# Patient Record
Sex: Female | Born: 1965 | Race: White | Hispanic: No | Marital: Single | Smoking: Never smoker
Health system: Southern US, Community
[De-identification: ages and names within clinical notes are randomized; demographics above are authoritative.]

---

## 1999-03-23 ENCOUNTER — Ambulatory Visit (HOSPITAL_COMMUNITY): Admission: RE | Admit: 1999-03-23 | Discharge: 1999-03-23 | Payer: Self-pay | Admitting: Orthopedic Surgery

## 1999-03-23 ENCOUNTER — Encounter: Payer: Self-pay | Admitting: Orthopedic Surgery

## 1999-06-19 ENCOUNTER — Encounter: Admission: RE | Admit: 1999-06-19 | Discharge: 1999-09-07 | Payer: Self-pay | Admitting: Anesthesiology

## 1999-09-07 ENCOUNTER — Encounter: Admission: RE | Admit: 1999-09-07 | Discharge: 1999-12-06 | Payer: Self-pay | Admitting: Anesthesiology

## 1999-10-27 ENCOUNTER — Other Ambulatory Visit: Admission: RE | Admit: 1999-10-27 | Discharge: 1999-10-27 | Payer: Self-pay | Admitting: Obstetrics & Gynecology

## 2000-01-16 ENCOUNTER — Other Ambulatory Visit: Admission: RE | Admit: 2000-01-16 | Discharge: 2000-01-16 | Payer: Self-pay | Admitting: Orthopedic Surgery

## 2000-08-07 ENCOUNTER — Other Ambulatory Visit: Admission: RE | Admit: 2000-08-07 | Discharge: 2000-08-07 | Payer: Self-pay | Admitting: *Deleted

## 2000-08-07 ENCOUNTER — Encounter (INDEPENDENT_AMBULATORY_CARE_PROVIDER_SITE_OTHER): Payer: Self-pay | Admitting: Specialist

## 2000-11-06 ENCOUNTER — Other Ambulatory Visit: Admission: RE | Admit: 2000-11-06 | Discharge: 2000-11-06 | Payer: Self-pay | Admitting: *Deleted

## 2000-12-25 ENCOUNTER — Ambulatory Visit (HOSPITAL_COMMUNITY): Admission: RE | Admit: 2000-12-25 | Discharge: 2000-12-25 | Payer: Self-pay | Admitting: Gastroenterology

## 2001-11-25 ENCOUNTER — Other Ambulatory Visit: Admission: RE | Admit: 2001-11-25 | Discharge: 2001-11-25 | Payer: Self-pay | Admitting: *Deleted

## 2003-01-14 ENCOUNTER — Other Ambulatory Visit: Admission: RE | Admit: 2003-01-14 | Discharge: 2003-01-14 | Payer: Self-pay | Admitting: *Deleted

## 2007-07-23 ENCOUNTER — Ambulatory Visit (HOSPITAL_COMMUNITY): Admission: RE | Admit: 2007-07-23 | Discharge: 2007-07-23 | Payer: Self-pay | Admitting: *Deleted

## 2007-07-31 ENCOUNTER — Encounter: Admission: RE | Admit: 2007-07-31 | Discharge: 2007-07-31 | Payer: Self-pay | Admitting: *Deleted

## 2010-09-22 NOTE — Procedures (Signed)
[place]  Patient:    Breanna Beasley, Breanna Beasley Visit Number: [id number] MRN: [id number]          Service Type: END Location: ENDO Attending Physician:  Charna Elizabeth Proc. Date: 12/25/00 Adm. Date:  12/25/2000   CC:         Marina Gravel, M.D.   Procedure Report  DATE OF BIRTH:  August 06, 1965.  REFERRING PHYSICIAN:  Marina Gravel, M.D.  PROCEDURE PERFORMED:  Colonoscopy.  ENDOSCOPIST:  Anselmo Rod, M.D.  INSTRUMENT USED:  Olympus pediatric video colonoscope.  INDICATIONS FOR PROCEDURE:  Rectal bleeding in a 45 year old white female with left lower quadrant pain Rule out colonic polyps, masses, hemorrhoids, etc.  PREPROCEDURE PREPARATION:  Informed consent was procured from the patient. The patient was fasted for eight hours prior to the procedure and prepped with a bottle of magnesium citrate and a gallon of NuLytely the night prior to the procedure.  PREPROCEDURE PHYSICAL:  The patient had stable vital signs.  Neck supple. Chest clear to auscultation.  S1, S2 regular.  Abdomen soft with normal abdominal bowel sounds.  Left lower quadrant tenderness on palpation with no guarding, rebound or rigidity, no hepatosplenomegaly.  DESCRIPTION OF PROCEDURE:  The patient was placed in the left lateral decubitus position and sedated with 70 mg of Demerol and 7 mg of Versed intravenously.  Once the patient was adequately sedated and maintained on low-flow oxygen and continuous cardiac monitoring, the Olympus video colonoscope was advanced from the rectum to the cecum without difficulty.  The entire colonic mucosa appeared healthy without erosions, ulcerations, masses, polyps or diverticulosis.  Small internal hemorrhoids were appreciated on retroflexion in the rectum.  The patient tolerated the procedure well without complication.  IMPRESSION:  Small internal hemorrhoids, otherwise normal colonoscopy to the terminal ileum.  RECOMMENDATIONS:  The  patient has been advised to maintain herself on a high fiber diet and follow up in the office on an outpatient basis as need arises. Attending Physician:  Charna Elizabeth DD:  12/25/00 TD:  12/25/00 Job: [id number]

## 2016-07-09 ENCOUNTER — Emergency Department (HOSPITAL_COMMUNITY)
Admission: EM | Admit: 2016-07-09 | Discharge: 2016-07-09 | Disposition: A | Discharge status: Home / Self Care | Payer: 59 | Attending: Emergency Medicine | Admitting: Emergency Medicine

## 2016-07-09 ENCOUNTER — Encounter (HOSPITAL_COMMUNITY): Payer: Self-pay | Admitting: Emergency Medicine

## 2016-07-09 ENCOUNTER — Emergency Department (HOSPITAL_COMMUNITY): Payer: 59

## 2016-07-09 DIAGNOSIS — J181 Lobar pneumonia, unspecified organism: Secondary | ICD-10-CM | POA: Diagnosis not present

## 2016-07-09 DIAGNOSIS — R55 Syncope and collapse: Secondary | ICD-10-CM | POA: Insufficient documentation

## 2016-07-09 DIAGNOSIS — R509 Fever, unspecified: Secondary | ICD-10-CM | POA: Diagnosis present

## 2016-07-09 DIAGNOSIS — J189 Pneumonia, unspecified organism: Secondary | ICD-10-CM

## 2016-07-09 LAB — BASIC METABOLIC PANEL
ANION GAP: 10 (ref 5–15)
CALCIUM: 9.6 mg/dL (ref 8.9–10.3)
CO2: 24 mmol/L (ref 22–32)
Chloride: 102 mmol/L (ref 101–111)
Creatinine, Ser: 0.73 mg/dL (ref 0.44–1.00)
GFR calc Af Amer: >60 mL/min (ref >60)
GFR calc non Af Amer: >60 mL/min (ref >60)
GLUCOSE: 90 mg/dL (ref 65–99)
POTASSIUM: 3.4 mmol/L — AB (ref 3.5–5.1)
Sodium: 136 mmol/L (ref 135–145)

## 2016-07-09 LAB — I-STAT TROPONIN, ED: Troponin i, poc: 0 ng/mL (ref 0.00–0.08)

## 2016-07-09 LAB — CBC
HEMATOCRIT: 40.2 % (ref 36.0–46.0)
HEMOGLOBIN: 13.3 g/dL (ref 12.0–15.0)
MCH: 32.3 pg (ref 26.0–34.0)
MCHC: 33.1 g/dL (ref 30.0–36.0)
MCV: 97.6 fL (ref 78.0–100.0)
Platelets: 309 10*3/uL (ref 150–400)
RBC: 4.12 MIL/uL (ref 3.87–5.11)
RDW: 12.9 % (ref 11.5–15.5)
WBC: 12.5 10*3/uL — ABNORMAL HIGH (ref 4.0–10.5)

## 2016-07-09 MED ORDER — SODIUM CHLORIDE 0.9 % IV BOLUS (SEPSIS)
1000.0000 mL | Freq: Once | INTRAVENOUS | Status: AC
  Administered 2016-07-09: 1000 mL via INTRAVENOUS

## 2016-07-09 MED ORDER — LEVOFLOXACIN 750 MG PO TABS: 750.0000 mg | ORAL_TABLET | Freq: Every day | ORAL | 0 refills | Status: AC

## 2016-07-09 MED ORDER — BENZONATATE 100 MG PO CAPS: 100.0000 mg | ORAL_CAPSULE | Freq: Three times a day (TID) | ORAL | 0 refills | Status: AC

## 2016-07-09 NOTE — ED Notes (Signed)
Gave pt ice water, per [employer] - [employer].

## 2016-07-09 NOTE — Discharge Instructions (Signed)
Stop Amoxicillin. Start Levaquin and take once daily with food.  Continue supportive care including rest, fluids, mucinex, Tylenol/Ibuprofen Return for worsening symptoms

## 2016-07-09 NOTE — ED Triage Notes (Signed)
Pt sts syncopal episode on plane Friday; pt sts URI sx but sent here from PCP for further eval

## 2016-07-09 NOTE — ED Provider Notes (Signed)
[facility]-EMERGENCY DEPT Provider Note   CSN: [id number] Arrival date & time: 07/09/16  1147     History   Chief Complaint Chief Complaint  Patient presents with  . Loss of Consciousness    HPI Breanna Beasley is a 51 y.o. female who presents with syncopal episode. Past medical history significant for allergies. She states she started to have gradually worsening allergy symptoms 6 days ago. [place] She went to [place] provider in [place] who prescribed her amoxicillin and Tessalon Perles. She's been taking these medications with worsening symptoms. She states 4 days ago she got on a flight to go back to [place]. She had not eaten in almost 24 hours but had been drinking fluids. She started to feel nauseous and felt like she was going to vomit so she stood up to go to the bathroom and subsequently passed out. A nurse and a doctor on the flight checked her blood pressure and it was reportedly low. Afterwards she felt back to her baseline but still felt unwell due to URI. [place] so she could be with her mom who lives here. She went and saw her mom's family doctor 3 days ago who urged her to come to the emergency department because her EKG looked "funny". She did not come to the ED over the weekend but endorses feeling "horrible". She came to the emergency room today because her family urged her to. She states her main complaint is her worsening URI symptoms. She denies headache, lightheadedness, recurrent syncopal event, chest pain, shortness of breath, abdominal pain. She has intermittent fever, chills, nasal congestion, sore throat, productive cough. She has been using Tylenol, Flonase, and Zyrtec with no relief.  HPI  History reviewed. No pertinent past medical history.  There are no active problems to display for this patient.   History reviewed. No pertinent surgical history.  OB History    No data  available       Home Medications    Prior to Admission medications   Not on File    Family History History reviewed. No pertinent family history.  Social History Social History  Substance Use Topics  . Smoking status: Never Smoker  . Smokeless tobacco: Never Used  . Alcohol use Yes     Allergies   Patient has no known allergies.   Review of Systems Review of Systems  Constitutional: Positive for chills and fever.  HENT: Positive for congestion and sore throat. Negative for ear pain and rhinorrhea.   Respiratory: Negative for shortness of breath and wheezing.   Cardiovascular: Negative for chest pain.  Gastrointestinal: Positive for nausea. Negative for abdominal pain, diarrhea and vomiting.  Neurological: Positive for syncope. Negative for dizziness, light-headedness and headaches.  All other systems reviewed and are negative.    Physical Exam Updated Vital Signs BP 141/89   Pulse 106   Temp 98.5 F (36.9 C) (Oral)   Resp 18   Ht 5\' 6"  (1.676 m)   Wt 56.7 kg   SpO2 100%   BMI 20.18 kg/m   Physical Exam  Constitutional: She is oriented to person, place, and time. She appears well-developed and well-nourished. No distress.  Appears younger than stated age  HENT:  Head: Normocephalic and atraumatic.  Right Ear: Hearing, tympanic membrane, external ear and ear canal normal.  Left Ear: Hearing, tympanic membrane, external ear and ear canal normal.  Nose:  Nose normal.  Mouth/Throat: Uvula is midline and mucous membranes are normal. Posterior oropharyngeal erythema present.  Eyes: Conjunctivae are normal. Pupils are equal, round, and reactive to light. Right eye exhibits no discharge. Left eye exhibits no discharge. No scleral icterus.  Neck: Normal range of motion.  Cardiovascular: Regular rhythm.  Tachycardia present.  Exam reveals no gallop and no friction rub.   No murmur heard. Pulmonary/Chest: Effort normal. No respiratory distress. She has decreased  breath sounds in the right upper field, the right middle field and the right lower field. She has no wheezes. She has no rales. She exhibits no tenderness.  Abdominal: Soft. Bowel sounds are normal. She exhibits no distension and no mass. There is no tenderness. There is no rebound and no guarding. No hernia.  Neurological: She is alert and oriented to person, place, and time.  Skin: Skin is warm and dry.  Psychiatric: She has a normal mood and affect. Her behavior is normal.  Nursing note and vitals reviewed.    ED Treatments / Results  Labs (all labs ordered are listed, but only abnormal results are displayed) Labs Reviewed  BASIC METABOLIC PANEL - Abnormal; Notable for the following:       Result Value   Potassium 3.4 (*)    BUN <5 (*)    All other components within normal limits  CBC - Abnormal; Notable for the following:    WBC 12.5 (*)    All other components within normal limits  I-STAT TROPOININ, ED    EKG  EKG Interpretation  Date/Time:  Monday July 09 2016 12:00:01 EST Ventricular Rate:  103 PR Interval:  112 QRS Duration: 74 QT Interval:  324 QTC Calculation: 424 R Axis:   73 Text Interpretation:  Sinus tachycardia Biatrial enlargement Nonspecific ST abnormality Abnormal ECG No old tracing to compare Confirmed by [facility] MD, PEDRO [phone]) on 07/09/2016 1:57:33 PM            Radiology Dg Chest 2 View  Result Date: 07/09/2016 CLINICAL DATA:  Syncope.  Cough. EXAM: CHEST  2 VIEW COMPARISON:  None. FINDINGS: There is a focal infiltrate in the right mid lung. The cardiomediastinal silhouette is normal. No pneumothorax. No other acute abnormalities. IMPRESSION: Probable pneumonia in the right mid lung given history. Recommend short-term follow-up to ensure resolution. Electronically Signed   By: Gerome Sam III M.D   On: 07/09/2016 14:36    Procedures Procedures (including critical care time)  Medications Ordered in ED Medications  sodium chloride 0.9 %  bolus 1,000 mL (1,000 mLs Intravenous New Bag/Given 07/09/16 1459)    Initial Impression / Assessment and Plan / ED Course  I have reviewed the triage vital signs and the nursing notes.  Pertinent labs & imaging results that were available during my care of the patient were reviewed by me and considered in my medical decision making (see chart for details).  51 year old female with syncopal event. Likely orthostatic from mild dehydration and respiratory illness. She is mildly tachycardic. Otherwise vitals are normal. Orthostatics here are negative. CBC remarkable for mild leukocytosis (12.5). BMP remarkable for mild hypokalemia. Troponin is 0. EKG from 3/1 reviewed. She has mild depression in V3 but otherwise normal EKG. EKG today re-demonstrates this and she has peaked P waves. No chest pain. Low concern for ACS in this overall healthy female. CXR remarkable for infiltrate in right middle lobe. Since patient has been on Amoxicillin with worsening symptoms, will change therapy to Levaquin. Advised continue supportive  care. Will d/c to home. Return precautions given.  Final Clinical Impressions(s) / ED Diagnoses   Final diagnoses:  Community acquired pneumonia of right middle lobe of lung [facility])    New Prescriptions New Prescriptions   No medications on file     Bethel Born, PA-C 07/09/16 1518    Nira Conn, MD 07/09/16 [phone]

## 2018-11-10 IMAGING — DX DG CHEST 2V
2 series · 2 of 2 positions shown · non-contrast
Comparison: None.

CLINICAL DATA: Syncope.  Cough.

EXAM:
CHEST  2 VIEW

[w chest pa]
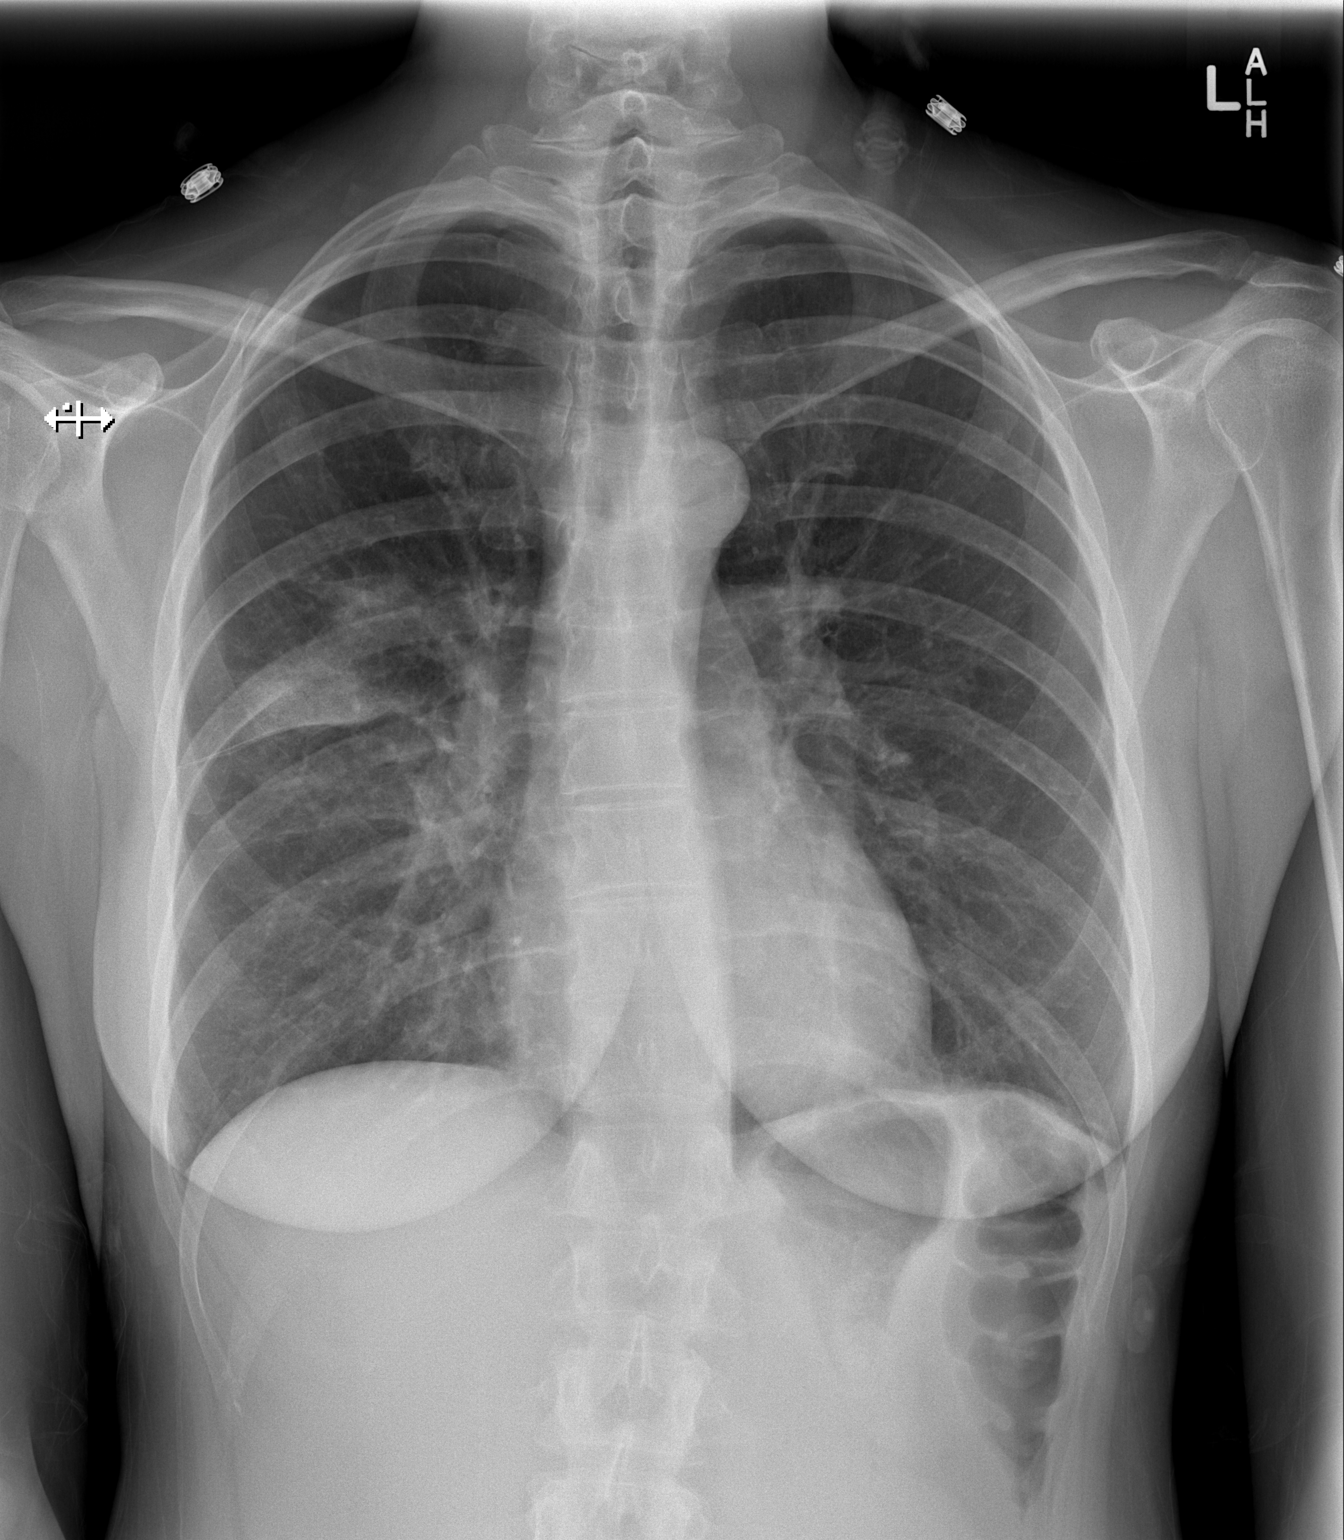

[w chest lat]
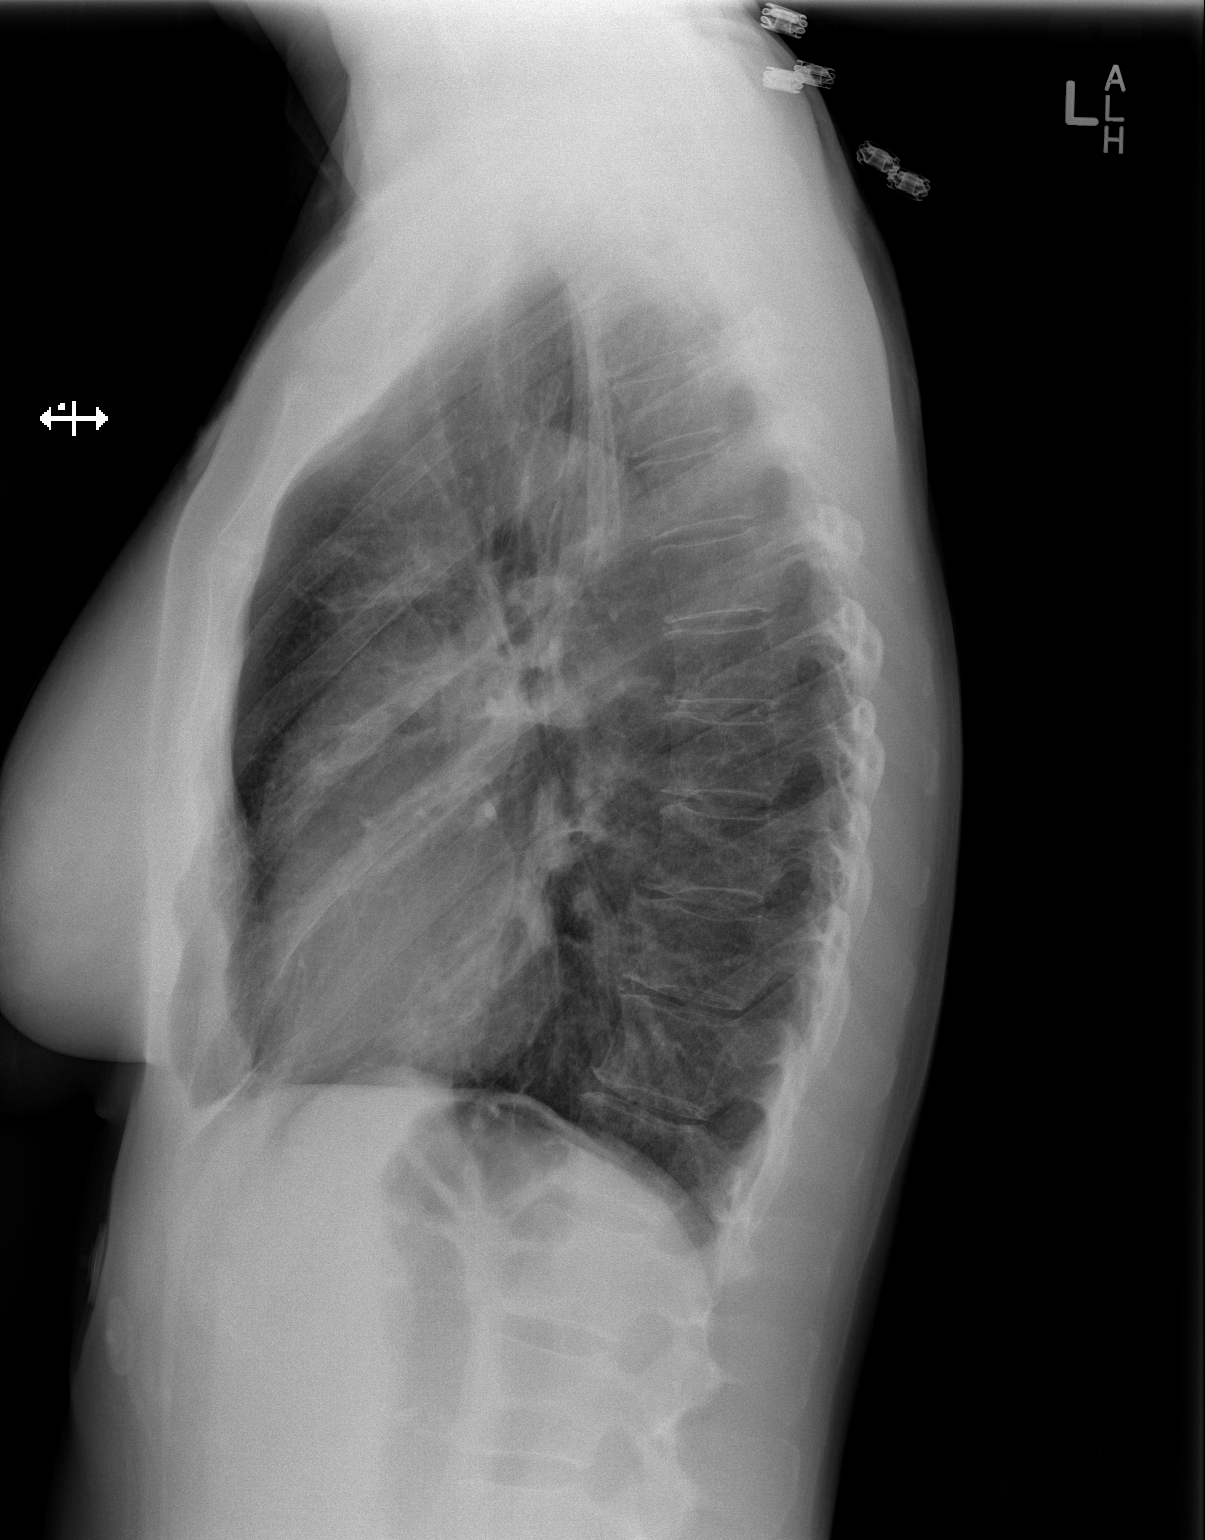

[2 of 2 positions shown; findings below may reference images not displayed]

FINDINGS: There is a focal infiltrate in the right mid lung. The
cardiomediastinal silhouette is normal. No pneumothorax. No other
acute abnormalities.
IMPRESSION: Probable pneumonia in the right mid lung given history. Recommend
short-term follow-up to ensure resolution.
